# Patient Record
Sex: Male | Born: 1980 | Hispanic: Yes | Marital: Married
Health system: Southern US, Community
[De-identification: ages and names within clinical notes are randomized; demographics above are authoritative.]

---

## 2011-03-24 ENCOUNTER — Ambulatory Visit: Payer: BC Managed Care – PPO | Attending: Family Medicine

## 2011-03-24 DIAGNOSIS — M25569 Pain in unspecified knee: Secondary | ICD-10-CM | POA: Insufficient documentation

## 2011-03-24 DIAGNOSIS — IMO0001 Reserved for inherently not codable concepts without codable children: Secondary | ICD-10-CM | POA: Insufficient documentation

## 2011-03-24 DIAGNOSIS — R5381 Other malaise: Secondary | ICD-10-CM | POA: Insufficient documentation

## 2011-03-28 ENCOUNTER — Ambulatory Visit: Payer: BC Managed Care – PPO | Admitting: Physical Therapy

## 2011-03-31 ENCOUNTER — Ambulatory Visit: Payer: BC Managed Care – PPO

## 2011-04-02 ENCOUNTER — Ambulatory Visit: Payer: BC Managed Care – PPO

## 2011-04-08 ENCOUNTER — Ambulatory Visit: Payer: BC Managed Care – PPO

## 2011-04-10 ENCOUNTER — Ambulatory Visit: Payer: BC Managed Care – PPO

## 2011-04-15 ENCOUNTER — Ambulatory Visit: Payer: BC Managed Care – PPO

## 2011-04-17 ENCOUNTER — Ambulatory Visit: Payer: BC Managed Care – PPO | Admitting: Physical Therapy

## 2011-04-20 ENCOUNTER — Other Ambulatory Visit: Payer: Self-pay | Admitting: Family Medicine

## 2013-09-28 ENCOUNTER — Other Ambulatory Visit: Payer: Self-pay | Admitting: Family Medicine

## 2013-09-28 ENCOUNTER — Ambulatory Visit
Admission: RE | Admit: 2013-09-28 | Discharge: 2013-09-28 | Disposition: A | Discharge status: Home / Self Care | Payer: BC Managed Care – PPO | Source: Ambulatory Visit | Attending: Family Medicine | Admitting: Family Medicine

## 2013-09-28 DIAGNOSIS — R079 Chest pain, unspecified: Secondary | ICD-10-CM

## 2015-08-21 IMAGING — CR DG CHEST 2V
2 series · 2 of 2 positions shown · non-contrast
Comparison: None.

CLINICAL DATA: Left anterior chest pain for 2 weeks.

EXAM:
CHEST  2 VIEW

[w chest pa]
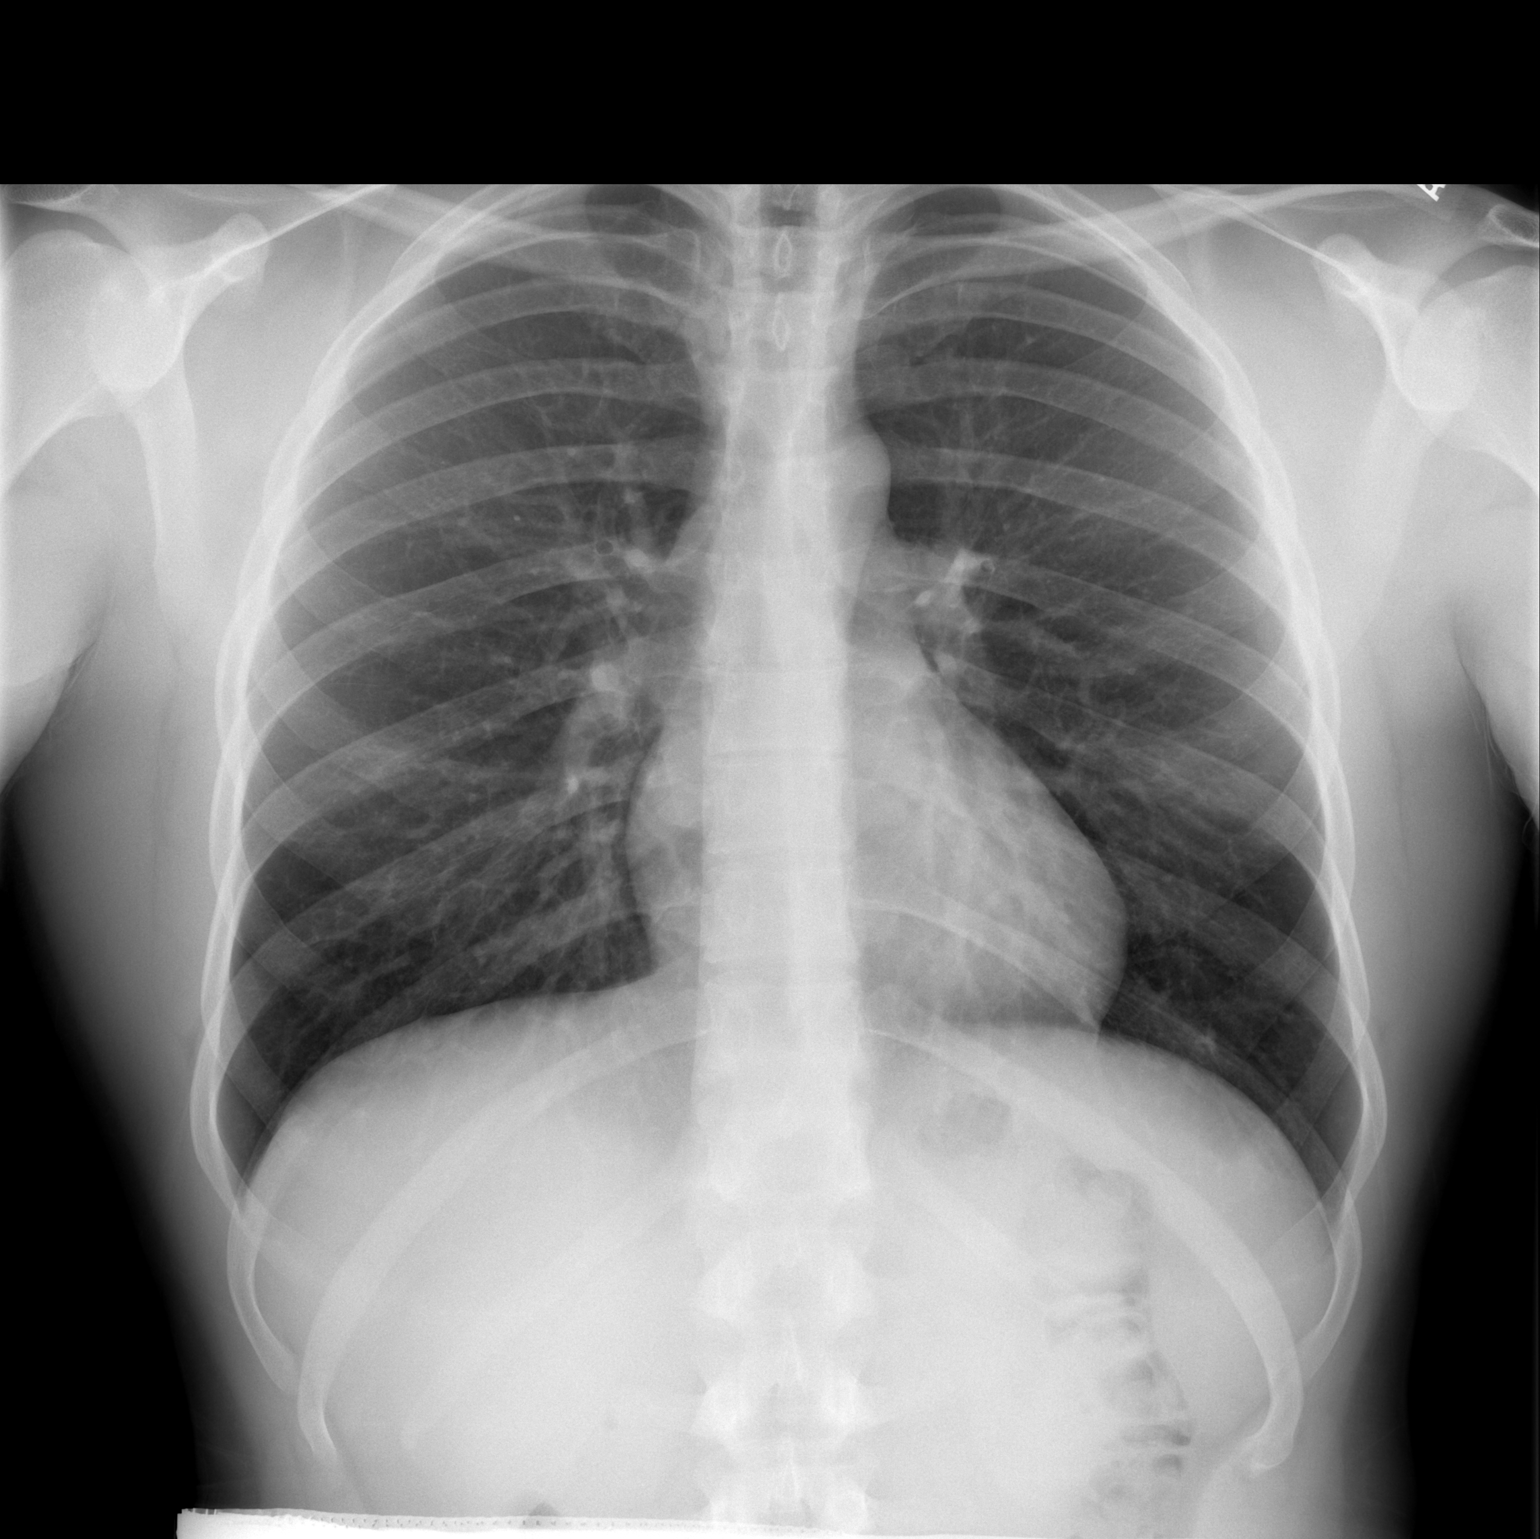

[w chest lat]
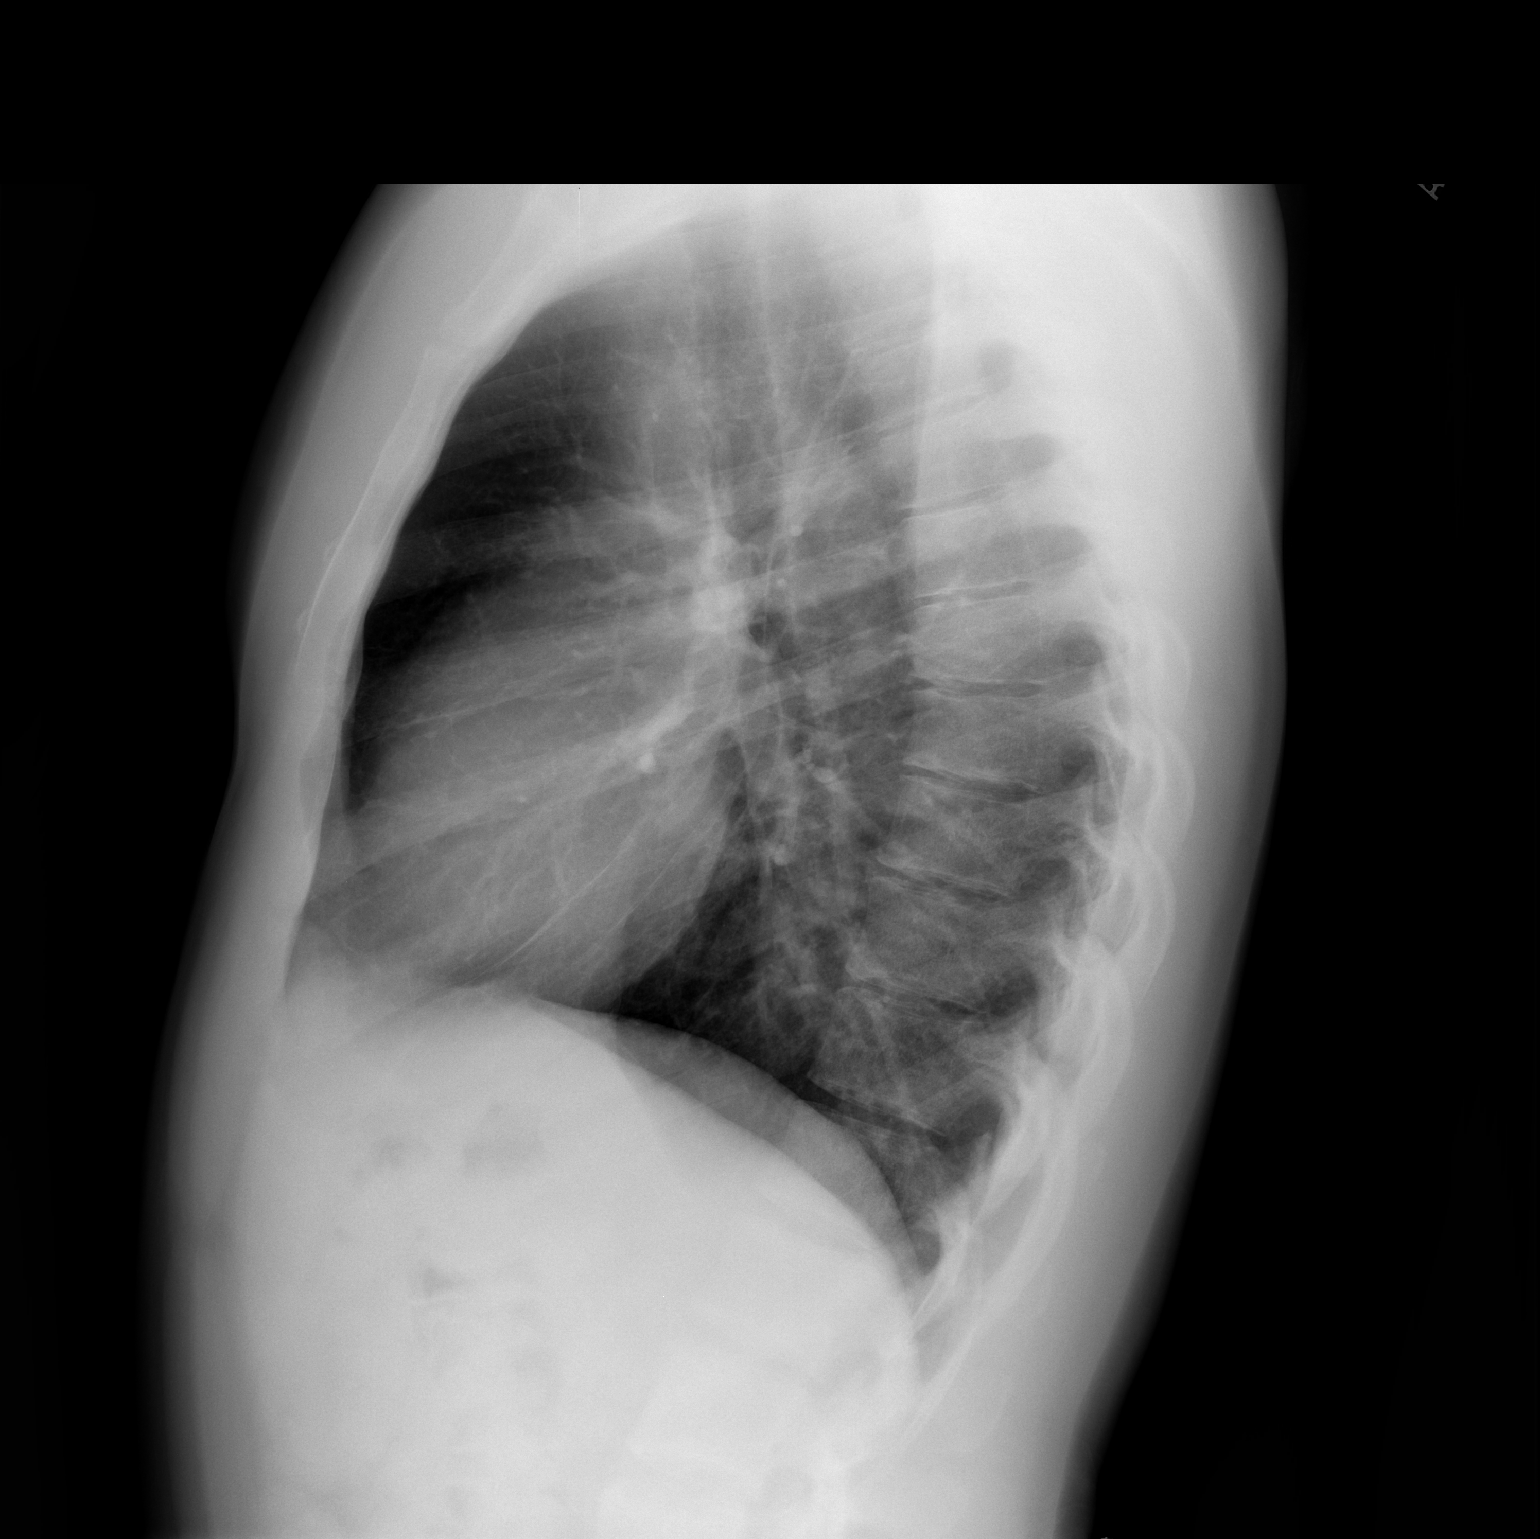

[2 of 2 positions shown; findings below may reference images not displayed]

FINDINGS: The heart size and mediastinal contours are within normal limits.
Both lungs are clear. The visualized skeletal structures are
unremarkable.
IMPRESSION: Normal exam.

## 2020-09-25 ENCOUNTER — Ambulatory Visit (INDEPENDENT_AMBULATORY_CARE_PROVIDER_SITE_OTHER): Payer: 59 | Admitting: Sports Medicine

## 2020-09-25 ENCOUNTER — Other Ambulatory Visit: Payer: Self-pay

## 2020-09-25 DIAGNOSIS — U071 COVID-19: Secondary | ICD-10-CM | POA: Insufficient documentation

## 2020-09-25 DIAGNOSIS — B349 Viral infection, unspecified: Secondary | ICD-10-CM | POA: Diagnosis not present

## 2020-09-25 NOTE — Assessment & Plan Note (Signed)
Viral syndrome, sick for a week, question COVID-19, PCR swab obtained today. 

## 2020-09-25 NOTE — Progress Notes (Signed)
    Procedures performed today:    None.  Independent interpretation of notes and tests performed by another provider:   None.  Brief History, Exam, Impression, and Recommendations:    No problem-specific Assessment & Plan notes found for this encounter.    ___________________________________________ Trina Asch J. Natalee Tomkiewicz, M.D., ABFM., CAQSM. Primary Care and Sports Medicine [facility]  Adjunct Instructor of Family Medicine  [employer] 

## 2020-09-27 LAB — SARS-COV-2, NAA 2 DAY TAT

## 2020-09-27 LAB — SPECIMEN STATUS REPORT

## 2020-09-27 LAB — NOVEL CORONAVIRUS, NAA: SARS-CoV-2, NAA: NOT DETECTED

## 2020-11-20 ENCOUNTER — Telehealth: Payer: Self-pay | Admitting: Sports Medicine

## 2020-11-20 MED ORDER — AZITHROMYCIN 250 MG PO TABS: ORAL_TABLET | ORAL | 0 refills | Status: DC

## 2020-11-20 MED ORDER — PREDNISONE 50 MG PO TABS: 50.0000 mg | ORAL_TABLET | Freq: Every day | ORAL | 0 refills | Status: DC

## 2020-11-20 NOTE — Telephone Encounter (Signed)
Malaise and productive cough for 2 weeks now, rest of family is sick and on abx.  Adding prednisone and azithro.  F/u with me if not improving after a couple days.

## 2021-06-15 ENCOUNTER — Other Ambulatory Visit: Payer: Self-pay | Admitting: Sports Medicine

## 2021-06-15 DIAGNOSIS — B349 Viral infection, unspecified: Secondary | ICD-10-CM

## 2021-06-15 DIAGNOSIS — U071 COVID-19: Secondary | ICD-10-CM

## 2021-06-15 MED ORDER — NIRMATRELVIR/RITONAVIR (PAXLOVID)TABLET: 3.0000 | ORAL_TABLET | Freq: Two times a day (BID) | ORAL | 0 refills | Status: DC

## 2021-06-15 NOTE — Assessment & Plan Note (Signed)
Classic symptoms, tested positive today, anticipatory guidance and isolation recs given.  Adding paxlovid.

## 2021-08-21 ENCOUNTER — Telehealth: Payer: Self-pay | Admitting: Sports Medicine

## 2021-08-21 DIAGNOSIS — R5383 Other fatigue: Secondary | ICD-10-CM | POA: Insufficient documentation

## 2021-08-21 NOTE — Telephone Encounter (Signed)
Mathew Nelson calls in requesting routine labs, orders placed.  He will return tomorrow fasting.

## 2021-08-26 LAB — COMPREHENSIVE METABOLIC PANEL
AG Ratio: 1.6 (calc) (ref 1.0–2.5)
ALT: 24 U/L (ref 9–46)
AST: 18 U/L (ref 10–40)
Albumin: 4.4 g/dL (ref 3.6–5.1)
Alkaline phosphatase (APISO): 56 U/L (ref 36–130)
BUN: 12 mg/dL (ref 7–25)
CO2: 30 mmol/L (ref 20–32)
Calcium: 9.3 mg/dL (ref 8.6–10.3)
Chloride: 104 mmol/L (ref 98–110)
Creat: 1.11 mg/dL (ref 0.60–1.26)
Globulin: 2.7 g/dL (calc) (ref 1.9–3.7)
Glucose, Bld: 91 mg/dL (ref 65–99)
Potassium: 4 mmol/L (ref 3.5–5.3)
Sodium: 142 mmol/L (ref 135–146)
Total Bilirubin: 0.7 mg/dL (ref 0.2–1.2)
Total Protein: 7.1 g/dL (ref 6.1–8.1)

## 2021-08-26 LAB — CBC
HCT: 47.5 % (ref 38.5–50.0)
Hemoglobin: 16 g/dL (ref 13.2–17.1)
MCH: 28.8 pg (ref 27.0–33.0)
MCHC: 33.7 g/dL (ref 32.0–36.0)
MCV: 85.4 fL (ref 80.0–100.0)
MPV: 10.1 fL (ref 7.5–12.5)
Platelets: 204 10*3/uL (ref 140–400)
RBC: 5.56 10*6/uL (ref 4.20–5.80)
RDW: 13.1 % (ref 11.0–15.0)
WBC: 6.3 10*3/uL (ref 3.8–10.8)

## 2021-08-26 LAB — LIPID PANEL
Cholesterol: 179 mg/dL (ref <200)
HDL: 34 mg/dL — ABNORMAL LOW (ref >=40)
LDL Cholesterol (Calc): 121 mg/dL (calc) — ABNORMAL HIGH
Non-HDL Cholesterol (Calc): 145 mg/dL (calc) — ABNORMAL HIGH (ref <130)
Total CHOL/HDL Ratio: 5.3 (calc) — ABNORMAL HIGH (ref <5.0)
Triglycerides: 126 mg/dL (ref <150)

## 2021-08-26 LAB — TESTOSTERONE, FREE & TOTAL
Free Testosterone: 53.1 pg/mL (ref 35.0–155.0)
Testosterone, Total, LC-MS-MS: 305 ng/dL (ref 250–1100)

## 2021-08-26 LAB — TSH: TSH: 4.5 mIU/L (ref 0.40–4.50)

## 2021-08-26 NOTE — Addendum Note (Signed)
Addended by: Monica Becton on: 08/26/2021 02:45 PM   Modules accepted: Orders

## 2021-08-26 NOTE — Assessment & Plan Note (Signed)
TSH is at the top of the normal range, testosterone at the bottom of the normal range, we will recheck both of these in about [redacted] weeks along with T3, T4, FSH, and LH, and if persistent lab values near the limits of the normal range we will consider treatment.

## 2021-10-13 LAB — T4, FREE: Free T4: 1.2 ng/dL (ref 0.8–1.8)

## 2021-10-13 LAB — FOLLICLE STIMULATING HORMONE: FSH: 5.6 m[IU]/mL (ref 1.6–8.0)

## 2021-10-13 LAB — T3, FREE: T3, Free: 3.6 pg/mL (ref 2.3–4.2)

## 2021-10-13 LAB — TESTOSTERONE, FREE & TOTAL
Free Testosterone: 90.3 pg/mL (ref 35.0–155.0)
Testosterone, Total, LC-MS-MS: 458 ng/dL (ref 250–1100)

## 2021-10-13 LAB — LUTEINIZING HORMONE: LH: 4.1 m[IU]/mL (ref 1.5–9.3)

## 2021-10-13 LAB — TSH: TSH: 7.05 mIU/L — ABNORMAL HIGH (ref 0.40–4.50)

## 2022-01-08 ENCOUNTER — Telehealth: Payer: Self-pay | Admitting: Sports Medicine

## 2022-01-08 DIAGNOSIS — H1013 Acute atopic conjunctivitis, bilateral: Secondary | ICD-10-CM

## 2022-01-08 DIAGNOSIS — H1010 Acute atopic conjunctivitis, unspecified eye: Secondary | ICD-10-CM | POA: Insufficient documentation

## 2022-01-08 MED ORDER — AZELASTINE HCL 0.05 % OP SOLN: 2.0000 [drp] | Freq: Two times a day (BID) | OPHTHALMIC | 11 refills | Status: DC

## 2022-01-08 NOTE — Telephone Encounter (Signed)
Patient recently returned from a trip to [place], having increasing bilateral eye itchiness, swelling, conjunctival irritation, injection. ?No viral symptoms. ?Also some runny nose, suspect allergic conjunctivitis, adding topical azelastine ophthalmic. ?

## 2022-01-08 NOTE — Assessment & Plan Note (Signed)
Patient recently returned from a trip to [place], having increasing bilateral eye itchiness, swelling, conjunctival irritation, injection. ?No viral symptoms. ?Also some runny nose, suspect allergic conjunctivitis, adding topical azelastine ophthalmic. ?

## 2022-02-25 ENCOUNTER — Ambulatory Visit (INDEPENDENT_AMBULATORY_CARE_PROVIDER_SITE_OTHER): Payer: 59 | Admitting: Sports Medicine

## 2022-02-25 DIAGNOSIS — J029 Acute pharyngitis, unspecified: Secondary | ICD-10-CM | POA: Diagnosis not present

## 2022-02-25 LAB — POCT RAPID STREP A (OFFICE): Rapid Strep A Screen: NEGATIVE

## 2022-02-25 MED ORDER — AMOXICILLIN-POT CLAVULANATE 875-125 MG PO TABS
1.0000 | ORAL_TABLET | Freq: Two times a day (BID) | ORAL | 0 refills | Status: DC
Start: 2022-02-25 — End: 2022-09-09

## 2022-02-25 MED ORDER — PREDNISONE 50 MG PO TABS: ORAL_TABLET | ORAL | 0 refills | Status: DC

## 2022-02-25 NOTE — Assessment & Plan Note (Signed)
Several days increasing pharyngitis, right ear pain. Strep negative, but high Centor score so we will do Augmentin and prednisone. Return as needed. Infectivity cautions given.

## 2022-02-25 NOTE — Progress Notes (Signed)
    Procedures performed today:    None.  Independent interpretation of notes and tests performed by another provider:   None.  Brief History, Exam, Impression, and Recommendations:    Pharyngitis Several days increasing pharyngitis, right ear pain. Strep negative, but high Centor score so we will do Augmentin and prednisone. Return as needed. Infectivity cautions given.    ___________________________________________ Ihor Austin. Benjamin Stain, M.D., ABFM., CAQSM. Primary Care and Sports Medicine [facility]  Adjunct Instructor of Family Medicine  [employer]

## 2022-02-25 NOTE — Addendum Note (Signed)
Addended by: Carolin Coy on: 02/25/2022 11:42 AM   Modules accepted: Orders

## 2022-04-24 ENCOUNTER — Telehealth: Payer: Self-pay | Admitting: Sports Medicine

## 2022-04-24 ENCOUNTER — Encounter: Payer: Self-pay | Admitting: Sports Medicine

## 2022-04-24 DIAGNOSIS — J40 Bronchitis, not specified as acute or chronic: Secondary | ICD-10-CM | POA: Insufficient documentation

## 2022-04-24 MED ORDER — BENZONATATE 200 MG PO CAPS: 200.0000 mg | ORAL_CAPSULE | Freq: Three times a day (TID) | ORAL | 0 refills | Status: DC | PRN

## 2022-04-24 MED ORDER — AZITHROMYCIN 250 MG PO TABS: ORAL_TABLET | ORAL | 0 refills | Status: DC

## 2022-04-24 NOTE — Assessment & Plan Note (Signed)
Previously healthy 40yo male, 3 weeks nonproductive cough, malaise.  No SOB/fevers/chills.  Suspect bronchitis vs pneumonia, covering for both, azithromycin/tessalon.  F/u if not better in a few days.  Son goes to preschool and is frequently ill as well. 

## 2022-04-24 NOTE — Telephone Encounter (Signed)
Previously healthy 41yo male, 3 weeks nonproductive cough, malaise.  No SOB/fevers/chills.  Suspect bronchitis vs pneumonia, covering for both, azithromycin/tessalon.  F/u if not better in a few days.  Son goes to preschool and is frequently ill as well.

## 2022-09-09 ENCOUNTER — Other Ambulatory Visit: Payer: Self-pay | Admitting: Sports Medicine

## 2022-09-09 MED ORDER — TADALAFIL 10 MG PO TABS: ORAL_TABLET | ORAL | 3 refills | Status: AC

## 2024-05-24 ENCOUNTER — Encounter: Payer: Self-pay | Admitting: Sports Medicine
